# Patient Record
Sex: Male | Born: 1995 | Race: White | Hispanic: No | Marital: Single | Smoking: Never smoker
Health system: Southern US, Community
[De-identification: ages and names within clinical notes are randomized; demographics above are authoritative.]

---

## 2020-11-25 ENCOUNTER — Encounter: Payer: Self-pay | Admitting: Adult Health

## 2020-11-25 ENCOUNTER — Ambulatory Visit (INDEPENDENT_AMBULATORY_CARE_PROVIDER_SITE_OTHER): Payer: 59 | Admitting: Adult Health

## 2020-11-25 ENCOUNTER — Other Ambulatory Visit: Payer: Self-pay

## 2020-11-25 VITALS — BP 120/76 | HR 97 | Temp 98.8°F | Resp 16 | Ht 71.0 in | Wt 173.0 lb

## 2020-11-25 DIAGNOSIS — J301 Allergic rhinitis due to pollen: Secondary | ICD-10-CM

## 2020-11-25 DIAGNOSIS — U071 COVID-19: Secondary | ICD-10-CM | POA: Diagnosis not present

## 2020-11-25 DIAGNOSIS — Z8709 Personal history of other diseases of the respiratory system: Secondary | ICD-10-CM

## 2020-11-25 DIAGNOSIS — M62838 Other muscle spasm: Secondary | ICD-10-CM

## 2020-11-25 DIAGNOSIS — E559 Vitamin D deficiency, unspecified: Secondary | ICD-10-CM

## 2020-11-25 DIAGNOSIS — R140 Abdominal distension (gaseous): Secondary | ICD-10-CM | POA: Insufficient documentation

## 2020-11-25 DIAGNOSIS — Z0000 Encounter for general adult medical examination without abnormal findings: Secondary | ICD-10-CM | POA: Diagnosis not present

## 2020-11-25 DIAGNOSIS — R202 Paresthesia of skin: Secondary | ICD-10-CM

## 2020-11-25 DIAGNOSIS — Z1389 Encounter for screening for other disorder: Secondary | ICD-10-CM

## 2020-11-25 DIAGNOSIS — H6123 Impacted cerumen, bilateral: Secondary | ICD-10-CM

## 2020-11-25 DIAGNOSIS — M542 Cervicalgia: Secondary | ICD-10-CM

## 2020-11-25 MED ORDER — CYCLOBENZAPRINE HCL 10 MG PO TABS: 10.0000 mg | ORAL_TABLET | Freq: Every evening | ORAL | 0 refills | Status: DC | PRN

## 2020-11-25 NOTE — Patient Instructions (Addendum)
Muscle Cramps and Spasms Muscle cramps and spasms are when muscles tighten by themselves. They usually get better within minutes. Muscle cramps are painful. They are usually stronger and last longer than muscle spasms. Muscle spasms may or may not be painful. They can last a few seconds or much longer. Cramps and spasms can affect any muscle, but they occur most often in the calf muscles of the leg. They are usually not caused by a serious problem. In many cases, the cause is not known. Some common causes include:  Doing more physical work or exercise than your body is ready for.  Using the muscles too much (overuse) by repeating certain movements too many times.  Staying in a certain position for a long time.  Playing a sport or doing an activity without preparing properly.  Using bad form or technique while playing a sport or doing an activity.  Not having enough water in your body (dehydration).  Injury.  Side effects of some medicines.  Low levels of the salts and minerals in your blood (electrolytes), such as low potassium or calcium. Follow these instructions at home: Managing pain and stiffness      Massage, stretch, and relax the muscle. Do this for many minutes at a time.  If told, put heat on tight or tense muscles as often as told by your doctor. Use the heat source that your doctor recommends, such as a moist heat pack or a heating pad. ? Place a towel between your skin and the heat source. ? Leave the heat on for 20-30 minutes. ? Remove the heat if your skin turns bright red. This is very important if you are not able to feel pain, heat, or cold. You may have a greater risk of getting burned.  If told, put ice on the affected area. This may help if you are sore or have pain after a cramp or spasm. ? Put ice in a plastic bag. ? Place a towel between your skin and the bag. ? Leave the ice on for 20 minutes, 2-3 times a day.  Try taking hot showers or baths to help  relax tight muscles. Eating and drinking  Drink enough fluid to keep your pee (urine) pale yellow.  Eat a healthy diet to help ensure that your muscles work well. This should include: ? Fruits and vegetables. ? Lean protein. ? Whole grains. ? Low-fat or nonfat dairy products. General instructions  If you are having cramps often, avoid intense exercise for several days.  Take over-the-counter and prescription medicines only as told by your doctor.  Watch for any changes in your symptoms.  Keep all follow-up visits as told by your doctor. This is important. Contact a doctor if:  Your cramps or spasms get worse or happen more often.  Your cramps or spasms do not get better with time. Summary  Muscle cramps and spasms are when muscles tighten by themselves. They usually get better within minutes.  Cramps and spasms occur most often in the calf muscles of the leg.  Massage, stretch, and relax the muscle. This may help the cramp or spasm go away.  Drink enough fluid to keep your pee (urine) pale yellow. This information is not intended to replace advice given to you by your health care provider. Make sure you discuss any questions you have with your health care provider. Document Revised: 04/01/2018 Document Reviewed: 04/01/2018 Elsevier Patient Education  2020 [employer] Inc. Debrox/ Carbamide Peroxide ear solution What is this medicine?  CARBAMIDE PEROXIDE (CAR bah mide per OX ide) is used to soften and help remove ear wax. This medicine may be used for other purposes; ask your health care provider or pharmacist if you have questions. COMMON BRAND NAME(S): Auro Ear, Auro Earache Relief, Debrox, Ear Drops, Ear Wax Removal, Ear Wax Remover, Earwax Treatment, Murine, Thera-Ear What should I tell my health care provider before I take this medicine? They need to know if you have any of these conditions:  dizziness  ear discharge  ear pain, irritation or  rash  infection  perforated eardrum (hole in eardrum)  an unusual or allergic reaction to carbamide peroxide, glycerin, hydrogen peroxide, other medicines, foods, dyes, or preservatives  pregnant or trying to get pregnant  breast-feeding How should I use this medicine? This medicine is only for use in the outer ear canal. Follow the directions carefully. Wash hands before and after use. The solution may be warmed by holding the bottle in the hand for 1 to 2 minutes. Lie with the affected ear facing upward. Place the proper number of drops into the ear canal. After the drops are instilled, remain lying with the affected ear upward for 5 minutes to help the drops stay in the ear canal. A cotton ball may be gently inserted at the ear opening for no longer than 5 to 10 minutes to ensure retention. Repeat, if necessary, for the opposite ear. Do not touch the tip of the dropper to the ear, fingertips, or other surface. Do not rinse the dropper after use. Keep container tightly closed. Talk to your pediatrician regarding the use of this medicine in children. While this drug may be used in children as young as 12 years for selected conditions, precautions do apply. Overdosage: If you think you have taken too much of this medicine contact a poison control center or emergency room at once. NOTE: This medicine is only for you. Do not share this medicine with others. What if I miss a dose? If you miss a dose, use it as soon as you can. If it is almost time for your next dose, use only that dose. Do not use double or extra doses. What may interact with this medicine? Interactions are not expected. Do not use any other ear products without asking your doctor or health care professional. This list may not describe all possible interactions. Give your health care provider a list of all the medicines, herbs, non-prescription drugs, or dietary supplements you use. Also tell them if you smoke, drink alcohol, or use  illegal drugs. Some items may interact with your medicine. What should I watch for while using this medicine? This medicine is not for long-term use. Do not use for more than 4 days without checking with your health care professional. Contact your doctor or health care professional if your condition does not start to get better within a few days or if you notice burning, redness, itching or swelling. What side effects may I notice from receiving this medicine? Side effects that you should report to your doctor or health care professional as soon as possible:  allergic reactions like skin rash, itching or hives, swelling of the face, lips, or tongue  burning, itching, and redness  worsening ear pain  rash Side effects that usually do not require medical attention (report to your doctor or health care professional if they continue or are bothersome):  abnormal sensation while putting the drops in the ear  temporary reduction in hearing (but not complete  loss of hearing) This list may not describe all possible side effects. Call your doctor for medical advice about side effects. You may report side effects to FDA at 1-800-FDA-1088. Where should I keep my medicine? Keep out of the reach of children. Store at room temperature between 15 and 30 degrees C (59 and 86 degrees F) in a tight, light-resistant container. Keep bottle away from excessive heat and direct sunlight. Throw away any unused medicine after the expiration date. NOTE: This sheet is a summary. It may not cover all possible information. If you have questions about this medicine, talk to your doctor, pharmacist, or health care provider.  2020 Elsevier/Gold Standard (2008-02-18 14:00:02) Health Maintenance, Male Adopting a healthy lifestyle and getting preventive care are important in promoting health and wellness. Ask your health care provider about:  The right schedule for you to have regular tests and exams.  Things you can do  on your own to prevent diseases and keep yourself healthy. What should I know about diet, weight, and exercise? Eat a healthy diet   Eat a diet that includes plenty of vegetables, fruits, low-fat dairy products, and lean protein.  Do not eat a lot of foods that are high in solid fats, added sugars, or sodium. Maintain a healthy weight Body mass index (BMI) is a measurement that can be used to identify possible weight problems. It estimates body fat based on height and weight. Your health care provider can help determine your BMI and help you achieve or maintain a healthy weight. Get regular exercise Get regular exercise. This is one of the most important things you can do for your health. Most adults should:  Exercise for at least 150 minutes each week. The exercise should increase your heart rate and make you sweat (moderate-intensity exercise).  Do strengthening exercises at least twice a week. This is in addition to the moderate-intensity exercise.  Spend less time sitting. Even light physical activity can be beneficial. Watch cholesterol and blood lipids Have your blood tested for lipids and cholesterol at 27 years of age, then have this test every 5 years. You may need to have your cholesterol levels checked more often if:  Your lipid or cholesterol levels are high.  You are older than 27 years of age.  You are at high risk for heart disease. What should I know about cancer screening? Many types of cancers can be detected early and may often be prevented. Depending on your health history and family history, you may need to have cancer screening at various ages. This may include screening for:  Colorectal cancer.  Prostate cancer.  Skin cancer.  Lung cancer. What should I know about heart disease, diabetes, and high blood pressure? Blood pressure and heart disease  High blood pressure causes heart disease and increases the risk of stroke. This is more likely to develop in  people who have high blood pressure readings, are of African descent, or are overweight.  Talk with your health care provider about your target blood pressure readings.  Have your blood pressure checked: ? Every 3-5 years if you are 7718-27 years of age. ? Every year if you are 27 years old or older.  If you are between the ages of 365 and 7475 and are a current or former smoker, ask your health care provider if you should have a one-time screening for abdominal aortic aneurysm (AAA). Diabetes Have regular diabetes screenings. This checks your fasting blood sugar level. Have the screening done:  Once every three years after age 84 if you are at a normal weight and have a low risk for diabetes.  More often and at a younger age if you are overweight or have a high risk for diabetes. What should I know about preventing infection? Hepatitis B If you have a higher risk for hepatitis B, you should be screened for this virus. Talk with your health care provider to find out if you are at risk for hepatitis B infection. Hepatitis C Blood testing is recommended for:  Everyone born from [birth year] through 1967.  Anyone with known risk factors for hepatitis C. Sexually transmitted infections (STIs)  You should be screened each year for STIs, including gonorrhea and chlamydia, if: ? You are sexually active and are younger than 27 years of age. ? You are older than 27 years of age and your health care provider tells you that you are at risk for this type of infection. ? Your sexual activity has changed since you were last screened, and you are at increased risk for chlamydia or gonorrhea. Ask your health care provider if you are at risk.  Ask your health care provider about whether you are at high risk for HIV. Your health care provider may recommend a prescription medicine to help prevent HIV infection. If you choose to take medicine to prevent HIV, you should first get tested for HIV. You should then be  tested every 3 months for as long as you are taking the medicine. Follow these instructions at home: Lifestyle  Do not use any products that contain nicotine or tobacco, such as cigarettes, e-cigarettes, and chewing tobacco. If you need help quitting, ask your health care provider.  Do not use street drugs.  Do not share needles.  Ask your health care provider for help if you need support or information about quitting drugs. Alcohol use  Do not drink alcohol if your health care provider tells you not to drink.  If you drink alcohol: ? Limit how much you have to 0-2 drinks a day. ? Be aware of how much alcohol is in your drink. In the [place] one drink equals one 12 oz bottle of beer (355 mL), one 5 oz glass of wine (148 mL), or one 1 oz glass of hard liquor (44 mL). General instructions  Schedule regular health, dental, and eye exams.  Stay current with your vaccines.  Tell your health care provider if: ? You often feel depressed. ? You have ever been abused or do not feel safe at home. Summary  Adopting a healthy lifestyle and getting preventive care are important in promoting health and wellness.  Follow your health care provider's instructions about healthy diet, exercising, and getting tested or screened for diseases.  Follow your health care provider's instructions on monitoring your cholesterol and blood pressure. This information is not intended to replace advice given to you by your health care provider. Make sure you discuss any questions you have with your health care provider. Document Revised: 10/30/2018 Document Reviewed: 10/30/2018 Elsevier Patient Education  2020 Elsevier Inc. Abdominal Bloating When you have abdominal bloating, your abdomen may feel full, tight, or painful. It may also look bigger than normal or swollen (distended). Common causes of abdominal bloating include:  Swallowing air.  Constipation.  Problems digesting food.  Eating too  much.  Irritable bowel syndrome. This is a condition that affects the large intestine.  Lactose intolerance. This is an inability to digest lactose, a natural sugar in dairy  products.  Celiac disease. This is a condition that affects the ability to digest gluten, a protein found in some grains.  Gastroparesis. This is a condition that slows down the movement of food in the stomach and small intestine. It is more common in people with diabetes mellitus.  Gastroesophageal reflux disease (GERD). This is a digestive condition that makes stomach acid flow back into the esophagus.  Urinary retention. This means that the body is holding onto urine, and the bladder cannot be emptied all the way. Follow these instructions at home: Eating and drinking  Avoid eating too much.  Try not to swallow air while talking or eating.  Avoid eating while lying down.  Avoid these foods and drinks: ? Foods that cause gas, such as broccoli, cabbage, cauliflower, and baked beans. ? Carbonated drinks. ? Hard candy. ? Chewing gum. Medicines  Take over-the-counter and prescription medicines only as told by your health care provider.  Take probiotic medicines. These medicines contain live bacteria or yeasts that can help digestion.  Take coated peppermint oil capsules. Activity  Try to exercise regularly. Exercise may help to relieve bloating that is caused by gas and relieve constipation. General instructions  Keep all follow-up visits as told by your health care provider. This is important. Contact a health care provider if:  You have nausea and vomiting.  You have diarrhea.  You have abdominal pain.  You have unusual weight loss or weight gain.  You have severe pain, and medicines do not help. Get help right away if:  You have severe chest pain.  You have trouble breathing.  You have shortness of breath.  You have trouble urinating.  You have darker urine than normal.  You have  blood in your stools or have dark, tarry stools. Summary  Abdominal bloating means that the abdomen is swollen.  Common causes of abdominal bloating are swallowing air, constipation, and problems digesting food.  Avoid eating too much and avoid swallowing air.  Avoid foods that cause gas, carbonated drinks, hard candy, and chewing gum. This information is not intended to replace advice given to you by your health care provider. Make sure you discuss any questions you have with your health care provider. Document Revised: 02/24/2019 Document Reviewed: 12/08/2016 Elsevier Patient Education  [place]. Psyllium granules or powder for solution What is this medicine? PSYLLIUM (SIL i yum) is a bulk-forming fiber laxative. This medicine is used to treat constipation. Increasing fiber in the diet may also help lower cholesterol and promote heart health for some people. This medicine may be used for other purposes; ask your health care provider or pharmacist if you have questions. COMMON BRAND NAME(S): Fiber Therapy, GenFiber, Geri-Mucil, Hydrocil, Konsyl, Metamucil, Metamucil MultiHealth, Mucilin, Natural Fiber Therapy, Reguloid What should I tell my health care provider before I take this medicine? They need to know if you have any of these conditions:  blockage in your bowel  difficulty swallowing  inflammatory bowel disease  phenylketonuria  stomach or intestine problems  sudden change in bowel habits lasting more than 2 weeks  an unusual or allergic reaction to psyllium, other medicines, dyes, or preservatives  pregnant or trying or get pregnant  breast-feeding How should I use this medicine? Mix this medicine into a full glass (240 mL) of water or other cool drink. Take this medicine by mouth. Follow the directions on the package labeling, or take as directed by your health care professional. Take your medicine at regular intervals. Do not take  your medicine more often  than directed. Talk to your pediatrician regarding the use of this medicine in children. While this drug may be prescribed for children as young as 27 years old for selected conditions, precautions do apply. Overdosage: If you think you have taken too much of this medicine contact a poison control center or emergency room at once. NOTE: This medicine is only for you. Do not share this medicine with others. What if I miss a dose? If you miss a dose, take it as soon as you can. If it is almost time for your next dose, take only that dose. Do not take double or extra doses. What may interact with this medicine? Interactions are not expected. Take this product at least 2 hours before or after other medicines. This list may not describe all possible interactions. Give your health care provider a list of all the medicines, herbs, non-prescription drugs, or dietary supplements you use. Also tell them if you smoke, drink alcohol, or use illegal drugs. Some items may interact with your medicine. What should I watch for while using this medicine? Check with your doctor or health care professional if your symptoms do not start to get better or if they get worse. Stop using this medicine and contact your doctor or health care professional if you have rectal bleeding or if you have to treat your constipation for more than 1 week. These could be signs of a more serious condition. Drink several glasses of water a day while you are taking this medicine. This will help to relieve constipation and prevent dehydration. What side effects may I notice from receiving this medicine? Side effects that you should report to your doctor or health care professional as soon as possible:  allergic reactions like skin rash, itching or hives, swelling of the face, lips, or tongue  breathing problems  chest pain  nausea, vomiting  rectal bleeding  trouble swallowing Side effects that usually do not require medical  attention (report to your doctor or health care professional if they continue or are bothersome):  bloating  gas  stomach cramps This list may not describe all possible side effects. Call your doctor for medical advice about side effects. You may report side effects to FDA at 1-800-FDA-1088. Where should I keep my medicine? Keep out of the reach of children. Store at room temperature between 15 and 30 degrees C (59 and 86 degrees F). Protect from moisture. Throw away any unused medicine after the expiration date. NOTE: This sheet is a summary. It may not cover all possible information. If you have questions about this medicine, talk to your doctor, pharmacist, or health care provider.  2020 Elsevier/Gold Standard (2018-04-02 15:41:08) Cyclobenzaprine tablets What is this medicine? CYCLOBENZAPRINE (sye kloe BEN za preen) is a muscle relaxer. It is used to treat muscle pain, spasms, and stiffness. This medicine may be used for other purposes; ask your health care provider or pharmacist if you have questions. COMMON BRAND NAME(S): Fexmid, Flexeril What should I tell my health care provider before I take this medicine? They need to know if you have any of these conditions:  heart disease, irregular heartbeat, or previous heart attack  liver disease  thyroid problem  an unusual or allergic reaction to cyclobenzaprine, tricyclic antidepressants, lactose, other medicines, foods, dyes, or preservatives  pregnant or trying to get pregnant  breast-feeding How should I use this medicine? Take this medicine by mouth with a glass of water. Follow the directions on the  prescription label. If this medicine upsets your stomach, take it with food or milk. Take your medicine at regular intervals. Do not take it more often than directed. Talk to your pediatrician regarding the use of this medicine in children. Special care may be needed. Overdosage: If you think you have taken too much of this  medicine contact a poison control center or emergency room at once. NOTE: This medicine is only for you. Do not share this medicine with others. What if I miss a dose? If you miss a dose, take it as soon as you can. If it is almost time for your next dose, take only that dose. Do not take double or extra doses. What may interact with this medicine? Do not take this medicine with any of the following medications:  MAOIs like Carbex, Eldepryl, Marplan, Nardil, and Parnate  narcotic medicines for cough  safinamide This medicine may also interact with the following medications:  alcohol  bupropion  antihistamines for allergy, cough and cold  certain medicines for anxiety or sleep  certain medicines for bladder problems like oxybutynin, tolterodine  certain medicines for depression like amitriptyline, fluoxetine, sertraline  certain medicines for Parkinson's disease like benztropine, trihexyphenidyl  certain medicines for seizures like phenobarbital, primidone  certain medicines for stomach problems like dicyclomine, hyoscyamine  certain medicines for travel sickness like scopolamine  general anesthetics like halothane, isoflurane, methoxyflurane, propofol  ipratropium  local anesthetics like lidocaine, pramoxine, tetracaine  medicines that relax muscles for surgery  narcotic medicines for pain  phenothiazines like chlorpromazine, mesoridazine, prochlorperazine, thioridazine  verapamil This list may not describe all possible interactions. Give your health care provider a list of all the medicines, herbs, non-prescription drugs, or dietary supplements you use. Also tell them if you smoke, drink alcohol, or use illegal drugs. Some items may interact with your medicine. What should I watch for while using this medicine? Tell your doctor or health care professional if your symptoms do not start to get better or if they get worse. You may get drowsy or dizzy. Do not drive, use  machinery, or do anything that needs mental alertness until you know how this medicine affects you. Do not stand or sit up quickly, especially if you are an older patient. This reduces the risk of dizzy or fainting spells. Alcohol may interfere with the effect of this medicine. Avoid alcoholic drinks. If you are taking another medicine that also causes drowsiness, you may have more side effects. Give your health care provider a list of all medicines you use. Your doctor will tell you how much medicine to take. Do not take more medicine than directed. Call emergency for help if you have problems breathing or unusual sleepiness. Your mouth may get dry. Chewing sugarless gum or sucking hard candy, and drinking plenty of water may help. Contact your doctor if the problem does not go away or is severe. What side effects may I notice from receiving this medicine? Side effects that you should report to your doctor or health care professional as soon as possible:  allergic reactions like skin rash, itching or hives, swelling of the face, lips, or tongue  breathing problems  chest pain  fast, irregular heartbeat  hallucinations  seizures  unusually weak or tired Side effects that usually do not require medical attention (report to your doctor or health care professional if they continue or are bothersome):  headache  nausea, vomiting This list may not describe all possible side effects. Call your doctor for  medical advice about side effects. You may report side effects to FDA at 1-800-FDA-1088. Where should I keep my medicine? Keep out of the reach of children. Store at room temperature between 15 and 30 degrees C (59 and 86 degrees F). Keep container tightly closed. Throw away any unused medicine after the expiration date. NOTE: This sheet is a summary. It may not cover all possible information. If you have questions about this medicine, talk to your doctor, pharmacist, or health care  provider.  2020 Elsevier/Gold Standard (2018-10-09 12:49:26)

## 2020-11-25 NOTE — Progress Notes (Signed)
New patient visit   Patient: Stephen Hernandez   DOB: [birth year]   24 y.o. Male  MRN: [id number] Visit Date: 11/25/2020  Today's healthcare provider: Jairo Ben, FNP   Chief Complaint  Patient presents with  . New Patient (Initial Visit)   Subjective    Stephen Hernandez is a 25 y.o. male who presents today as a new patient to establish care.  HPI  Patient reports that he feels fairly well today but has concerns to address. Patient reports that he had covid in October 2021 and states that when he had covid he experienced increased gas and states that he had episodes of neck pain and facial numbness of left side. Patient reports that he received covid vaccine yesteday and states that he began having neck pain and facial numbness again   Patient  denies any fever, body aches,chills, rash, chest pain, shortness of breath, nausea, vomiting, or diarrhea.  Denies dizziness, lightheadedness, pre syncopal or syncopal episodes.    History reviewed. No pertinent past medical history. History reviewed. No pertinent surgical history. Family Status  Relation Name Status  . Mother  Alive  . Father  Alive   Family History  Problem Relation Age of Onset  . Fibromyalgia Mother   . Seizures Father    Social History   Socioeconomic History  . Marital status: Single    Spouse name: Not on file  . Number of children: Not on file  . Years of education: Not on file  . Highest education level: Not on file  Occupational History  . Not on file  Tobacco Use  . Smoking status: Never Smoker  . Smokeless tobacco: Never Used  Substance and Sexual Activity  . Alcohol use: Never  . Drug use: Never  . Sexual activity: Yes  Other Topics Concern  . Not on file  Social History Narrative  . Not on file   Social Determinants of Health   Financial Resource Strain: Not on file  Food Insecurity: Not on file  Transportation Needs: Not on file  Physical Activity: Not on file  Stress: Not  on file  Social Connections: Not on file   No outpatient medications prior to visit.   No facility-administered medications prior to visit.   Allergies  Allergen Reactions  . Penicillins Hives     There is no immunization history on file for this patient.  Health Maintenance  Topic Date Due  . COVID-19 Vaccine (1) Never done  . INFLUENZA VACCINE  02/17/2021 (Originally 06/20/2020)  . TETANUS/TDAP  11/25/2021 (Originally 02/20/2013)  . Hepatitis C Screening  11/25/2021 (Originally Jun 13, 1996)  . HIV Screening  11/25/2021 (Originally 02/20/2009)    Patient Care Team: Flinchum, Eula Fried, FNP as PCP - General (Family Medicine)  Review of Systems  Constitutional: Negative.   HENT: Negative.   Eyes: Negative.   Respiratory: Negative.   Cardiovascular: Negative.   Gastrointestinal: Negative.   Endocrine: Negative.   Genitourinary: Negative.   Musculoskeletal: Negative.   Skin: Negative.   Allergic/Immunologic: Negative.   Neurological: Positive for numbness (x 2 see note ). Negative for dizziness, tremors, seizures, syncope, facial asymmetry, speech difficulty, weakness, light-headedness and headaches.  Hematological: Negative.   Psychiatric/Behavioral: Negative.       Objective    BP 120/76   Pulse 97   Temp 98.8 F (37.1 C) (Oral)   Resp 16   Ht 5\' 11"  (1.803 m)   Wt 173 lb (78.5 kg)   SpO2 100%  BMI 24.13 kg/m  Physical Exam Vitals and nursing note reviewed.  Constitutional:      General: He is awake. He is not in acute distress.    Appearance: Normal appearance. He is well-groomed and normal weight. He is not ill-appearing, toxic-appearing or diaphoretic.     Comments: Patient appers well, not sickly. Speaking in complete sentences. Patient moves on and off of exam table and in room without difficulty. Gait is normal in hall and in room. Patient is oriented to person place time and situation. Patient answers questions appropriately and engages eye contact and  verbal dialect with provider.   HENT:     Head: Normocephalic and atraumatic.     Right Ear: Tympanic membrane normal. There is impacted cerumen.     Left Ear: Tympanic membrane normal. There is impacted cerumen.     Nose: Nose normal.     Mouth/Throat:     Mouth: Mucous membranes are moist.     Pharynx: No oropharyngeal exudate or posterior oropharyngeal erythema.  Eyes:     General: No visual field deficit or scleral icterus.       Right eye: No discharge.        Left eye: No discharge.     Extraocular Movements: Extraocular movements intact.     Conjunctiva/sclera: Conjunctivae normal.     Pupils: Pupils are equal, round, and reactive to light.  Neck:     Vascular: No carotid bruit.  Cardiovascular:     Rate and Rhythm: Normal rate and regular rhythm.     Pulses: Normal pulses.     Heart sounds: Normal heart sounds. No murmur heard. No friction rub. No gallop.   Pulmonary:     Effort: Pulmonary effort is normal. No respiratory distress.     Breath sounds: Normal breath sounds. No stridor. No wheezing, rhonchi or rales.  Chest:     Chest wall: No tenderness.  Abdominal:     General: There is no distension.     Palpations: Abdomen is soft.     Tenderness: There is no abdominal tenderness.  Genitourinary:    Comments: Deferred.  Musculoskeletal:        General: Tenderness present. No swelling, deformity or signs of injury. Normal range of motion.     Cervical back: Normal range of motion and neck supple. Spasms present. No swelling, edema, deformity, erythema, signs of trauma, lacerations, rigidity, torticollis, tenderness, bony tenderness or crepitus. No pain with movement. Normal range of motion.     Thoracic back: Normal.     Lumbar back: Normal.       Back:     Right lower leg: No deformity, lacerations, tenderness or bony tenderness. 2+ Edema present.     Left lower leg: No deformity, lacerations, tenderness or bony tenderness. 2+ Edema present.  Lymphadenopathy:      Head:     Right side of head: No submental, submandibular, tonsillar, preauricular, posterior auricular or occipital adenopathy.     Left side of head: No submental, submandibular, tonsillar, preauricular, posterior auricular or occipital adenopathy.     Cervical: No cervical adenopathy.     Right cervical: No superficial, deep or posterior cervical adenopathy.    Left cervical: No superficial, deep or posterior cervical adenopathy.  Skin:    General: Skin is warm.     Capillary Refill: Capillary refill takes less than 2 seconds.     Findings: No erythema or rash.  Neurological:     Mental Status: He is alert  and oriented to person, place, and time.     GCS: GCS eye subscore is 4. GCS verbal subscore is 5. GCS motor subscore is 6.     Cranial Nerves: Cranial nerves are intact. No cranial nerve deficit, dysarthria or facial asymmetry.     Sensory: Sensation is intact.     Motor: Motor function is intact. No weakness.     Coordination: Coordination is intact.     Gait: Gait normal.     Deep Tendon Reflexes: Reflexes normal.     Reflex Scores:      Tricep reflexes are 2+ on the right side and 2+ on the left side.      Patellar reflexes are 2+ on the right side and 2+ on the left side. Psychiatric:        Attention and Perception: Attention normal.        Mood and Affect: Mood normal.        Speech: Speech normal.        Behavior: Behavior normal. Behavior is cooperative.        Thought Content: Thought content normal.        Cognition and Memory: Cognition and memory normal.        Judgment: Judgment normal.     Depression Screen No flowsheet data found. No results found for any visits on 11/25/20.  Assessment & Plan      Routine health maintenance - Plan: Lipid Panel w/o Chol/HDL Ratio  Lab test positive for detection of COVID-19 virus-08/2020  History of strep sore throat  Non-seasonal allergic rhinitis due to pollen  Neck pain - Plan: CBC with Differential/Platelet,  Comprehensive Metabolic Panel (CMET), TSH, B12 and Folate Panel  Paresthesia - Plan: CBC with Differential/Platelet, Comprehensive Metabolic Panel (CMET), TSH, B12 and Folate Panel  Vitamin D deficiency - Plan: VITAMIN D 25 Hydroxy (Vit-D Deficiency, Fractures)  Screening for blood or protein in urine - Plan: Urinalysis, Routine w reflex microscopic  Gassiness - Plan: DG Abd 1 View, Celiac Disease Panel  Muscle spasm  Bilateral impacted cerumen   Orders Placed This Encounter  Procedures  . DG Abd 1 View  . CBC with Differential/Platelet  . Comprehensive Metabolic Panel (CMET)  . TSH  . B12 and Folate Panel  . Urinalysis, Routine w reflex microscopic  . Lipid Panel w/o Chol/HDL Ratio  . VITAMIN D 25 Hydroxy (Vit-D Deficiency, Fractures)  . Celiac Disease Panel    Meds ordered this encounter  Medications  . cyclobenzaprine (FLEXERIL) 10 MG tablet    Sig: Take 1 tablet (10 mg total) by mouth at bedtime as needed for muscle spasms.    Dispense:  30 tablet    Refill:  0  discussed side effects of medication as above.   aadvise Debrox per package and retuirn for cerumen recheck and possible irrigation unable to visualize tympanic membrane or canals of ears due to cerumen. He denies any pain.   Red Flags discussed. The patient was given clear instructions to go to ER or return to medical center if any red flags develop, symptoms do not improve, worsen or new problems develop. They verbalized understanding.   Return in about 1 month (around 12/26/2020), or if symptoms worsen or fail to improve, for at any time for any worsening symptoms, Go to Emergency room/ urgent care if worse.    The entirety of the information documented in the History of Present Illness, Review of Systems and Physical Exam were personally obtained by me. Portions  of this information were initially documented by the CMA and reviewed by me for thoroughness and accuracy.     Jairo Ben, FNP   [facility] [phone] (phone) [phone] (fax)  [facility]

## 2020-11-26 LAB — URINALYSIS, ROUTINE W REFLEX MICROSCOPIC
Bilirubin, UA: NEGATIVE
Glucose, UA: NEGATIVE
Leukocytes,UA: NEGATIVE
Nitrite, UA: NEGATIVE
Protein,UA: NEGATIVE
RBC, UA: NEGATIVE
Specific Gravity, UA: 1.025 (ref 1.005–1.030)
Urobilinogen, Ur: 1 mg/dL (ref 0.2–1.0)
pH, UA: 7 (ref 5.0–7.5)

## 2020-12-20 ENCOUNTER — Other Ambulatory Visit: Payer: Self-pay | Admitting: Adult Health

## 2020-12-20 DIAGNOSIS — E559 Vitamin D deficiency, unspecified: Secondary | ICD-10-CM

## 2020-12-20 MED ORDER — VITAMIN D (ERGOCALCIFEROL) 1.25 MG (50000 UNIT) PO CAPS: 50000.0000 [IU] | ORAL_CAPSULE | ORAL | 0 refills | Status: AC

## 2020-12-20 NOTE — Progress Notes (Signed)
CBC, CMP, B12 and folate within normal limits.  Triglycerides elevated.  Discuss lifestyle modification with patient e.g. increase exercise, fiber, fruits, vegetables, lean meat, and omega 3/fish intake and decrease saturated fat.  Increase exercise.   Vitamin  D is low, this can contribute to poor sleep and fatigue, will send in prescription for Vitamin D at 50,000 units by mouth once every 7 days/(once weekly) for 12 weeks. Advise recheck lab Vitamin D in 1-2 weeks after completing vitamin d prescription. Lab iis walk in and is closed during lunch during regular office hours. Sent Vitamin D and ordered follow up lab.   Urinalysis was negative.  Celiac panel pending

## 2020-12-20 NOTE — Progress Notes (Signed)
Meds ordered this encounter  Medications  . Vitamin D, Ergocalciferol, (DRISDOL) 1.25 MG (50000 UNIT) CAPS capsule    Sig: Take 1 capsule (50,000 Units total) by mouth every 7 (seven) days. (taking one tablet per week) walk in lab in office 1-2 weeks after completing prescription.    Dispense:  12 capsule    Refill:  0   Orders Placed This Encounter  Procedures  . VITAMIN D 25 Hydroxy (Vit-D Deficiency, Fractures)   

## 2020-12-21 LAB — URINALYSIS, ROUTINE W REFLEX MICROSCOPIC
Bilirubin, UA: NEGATIVE
Glucose, UA: NEGATIVE
Ketones, UA: NEGATIVE
Leukocytes,UA: NEGATIVE
Nitrite, UA: NEGATIVE
Protein,UA: NEGATIVE
RBC, UA: NEGATIVE
Specific Gravity, UA: 1.028 (ref 1.005–1.030)
Urobilinogen, Ur: 1 mg/dL (ref 0.2–1.0)
pH, UA: 7 (ref 5.0–7.5)

## 2020-12-21 LAB — COMPREHENSIVE METABOLIC PANEL
ALT: 22 IU/L (ref 0–44)
AST: 23 IU/L (ref 0–40)
Albumin/Globulin Ratio: 1.7 (ref 1.2–2.2)
Albumin: 4.5 g/dL (ref 4.1–5.2)
Alkaline Phosphatase: 59 IU/L (ref 44–121)
BUN/Creatinine Ratio: 18 (ref 9–20)
BUN: 19 mg/dL (ref 6–20)
Bilirubin Total: <0.2 mg/dL (ref 0.0–1.2)
CO2: 21 mmol/L (ref 20–29)
Calcium: 9.5 mg/dL (ref 8.7–10.2)
Chloride: 103 mmol/L (ref 96–106)
Creatinine, Ser: 1.03 mg/dL (ref 0.76–1.27)
GFR calc Af Amer: 115 mL/min/{1.73_m2} (ref >59)
GFR calc non Af Amer: 100 mL/min/{1.73_m2} (ref >59)
Globulin, Total: 2.6 g/dL (ref 1.5–4.5)
Glucose: 66 mg/dL (ref 65–99)
Potassium: 4.8 mmol/L (ref 3.5–5.2)
Sodium: 140 mmol/L (ref 134–144)
Total Protein: 7.1 g/dL (ref 6.0–8.5)

## 2020-12-21 LAB — CELIAC DISEASE PANEL
Endomysial IgA: NEGATIVE
IgA/Immunoglobulin A, Serum: 288 mg/dL (ref 90–386)
Transglutaminase IgA: <2 U/mL (ref 0–3)

## 2020-12-21 LAB — CBC WITH DIFFERENTIAL/PLATELET
Basophils Absolute: 0.1 10*3/uL (ref 0.0–0.2)
Basos: 1 %
EOS (ABSOLUTE): 0.1 10*3/uL (ref 0.0–0.4)
Eos: 1 %
Hematocrit: 49.4 % (ref 37.5–51.0)
Hemoglobin: 16.4 g/dL (ref 13.0–17.7)
Immature Grans (Abs): 0 10*3/uL (ref 0.0–0.1)
Immature Granulocytes: 1 %
Lymphocytes Absolute: 3.1 10*3/uL (ref 0.7–3.1)
Lymphs: 37 %
MCH: 27.9 pg (ref 26.6–33.0)
MCHC: 33.2 g/dL (ref 31.5–35.7)
MCV: 84 fL (ref 79–97)
Monocytes Absolute: 0.7 10*3/uL (ref 0.1–0.9)
Monocytes: 9 %
Neutrophils Absolute: 4.4 10*3/uL (ref 1.4–7.0)
Neutrophils: 51 %
Platelets: 340 10*3/uL (ref 150–450)
RBC: 5.87 x10E6/uL — ABNORMAL HIGH (ref 4.14–5.80)
RDW: 12 % (ref 11.6–15.4)
WBC: 8.3 10*3/uL (ref 3.4–10.8)

## 2020-12-21 LAB — TSH: TSH: 1.32 u[IU]/mL (ref 0.450–4.500)

## 2020-12-21 LAB — LIPID PANEL W/O CHOL/HDL RATIO
Cholesterol, Total: 163 mg/dL (ref 100–199)
HDL: 40 mg/dL (ref >39)
LDL Chol Calc (NIH): 91 mg/dL (ref 0–99)
Triglycerides: 187 mg/dL — ABNORMAL HIGH (ref 0–149)
VLDL Cholesterol Cal: 32 mg/dL (ref 5–40)

## 2020-12-21 LAB — B12 AND FOLATE PANEL
Folate: 11.3 ng/mL (ref >3.0)
Vitamin B-12: 471 pg/mL (ref 232–1245)

## 2020-12-21 LAB — VITAMIN D 25 HYDROXY (VIT D DEFICIENCY, FRACTURES): Vit D, 25-Hydroxy: 25.9 ng/mL — ABNORMAL LOW (ref 30.0–100.0)

## 2020-12-23 ENCOUNTER — Ambulatory Visit: Payer: Self-pay | Admitting: Adult Health

## 2020-12-23 NOTE — Telephone Encounter (Signed)
Addressed in result notes; verbalizes understanding.

## 2020-12-27 NOTE — Progress Notes (Deleted)
      Established patient visit   Patient: Stephen Hernandez   DOB: Sep 02, 1996   25 y.o. Male  MRN: [id number] Visit Date: 12/28/2020  Today's healthcare provider: Jairo Ben, FNP   No chief complaint on file.  Subjective    HPI  Follow up for neck pain /muscle spasms  The patient was last seen for this 1 months ago. Changes made at last visit include started patient on Flexeril 10mg .  He reports {excellent/good/fair/poor:19665} compliance with treatment. He feels that condition is {improved/worse/unchanged:3041574}. He {is/is not:21021397} having side effects. ***  -----------------------------------------------------------------------------------------    {Show patient history (optional):23778::" "}   Medications: Outpatient Medications Prior to Visit  Medication Sig  . Vitamin D, Ergocalciferol, (DRISDOL) 1.25 MG (50000 UNIT) CAPS capsule Take 1 capsule (50,000 Units total) by mouth every 7 (seven) days. (taking one tablet per week) walk in lab in office 1-2 weeks after completing prescription.  . cyclobenzaprine (FLEXERIL) 10 MG tablet Take 1 tablet (10 mg total) by mouth at bedtime as needed for muscle spasms.   No facility-administered medications prior to visit.    Review of Systems  {Labs  Heme  Chem  Endocrine  Serology  Results Review (optional):23779::" "}   Objective    There were no vitals taken for this visit. {Show previous vital signs (optional):23777::" "}   Physical Exam  ***  No results found for any visits on 12/28/20.  Assessment & Plan     ***  No follow-ups on file.      {provider attestation***:1}   02/25/21, FNP  [facility] [phone] (phone) [phone] (fax)  [facility]

## 2020-12-28 ENCOUNTER — Ambulatory Visit: Payer: Self-pay | Admitting: Adult Health

## 2020-12-30 ENCOUNTER — Ambulatory Visit (INDEPENDENT_AMBULATORY_CARE_PROVIDER_SITE_OTHER): Payer: 59 | Admitting: Adult Health

## 2020-12-30 ENCOUNTER — Other Ambulatory Visit: Payer: Self-pay

## 2020-12-30 ENCOUNTER — Encounter: Payer: Self-pay | Admitting: Adult Health

## 2020-12-30 VITALS — BP 119/73 | HR 86 | Resp 16 | Wt 169.2 lb

## 2020-12-30 DIAGNOSIS — M542 Cervicalgia: Secondary | ICD-10-CM | POA: Diagnosis not present

## 2020-12-30 NOTE — Patient Instructions (Addendum)
Triglycerides Test Why am I having this test? Triglycerides are a type of fat in the body. Having a high level of triglycerides can increase your risk for heart disease. You may have this test as part of a routine physical exam. Health care providers recommend that adults have this test at least once every 5 years. If you have risk factors for heart disease or are being treated for high triglycerides, you may need to have this test more often. What is being tested? This test measures the amount of triglycerides in your blood. Triglycerides are naturally present in the body, and you also take in triglycerides by eating certain foods. Triglycerides may be measured as part of a test called a lipid profile, which tests triglycerides and cholesterol levels. What kind of sample is taken? A blood sample is required for this test. It may be collected by inserting a needle into a blood vessel, or by pricking a fingertip with a small needle (finger stick).   How do I prepare for this test?  Follow instructions from your health care provider about changing or stopping your regular medicines.  Do not eat or drink anything except water starting 9-12 hours before your test, or as long as told by your health care provider.  Do not drink alcohol starting at least 24 hours before your test.  Follow any instructions from your health care provider about dietary restrictions before your test. Tell a health care provider about:  All medicines you are taking, including vitamins, herbs, eye drops, creams, and over-the-counter medicines.  Any blood disorders you have.  Any medical conditions you have. How are the results reported? Your test results will be reported as a value that indicates how many triglycerides are in your blood. This will be given as milligrams of triglycerides per deciliter of blood (mg/dL). Your health care provider will compare your results to normal values that were established after  testing a large group of people (reference ranges). Reference ranges may vary among labs and hospitals. For this test, common reference ranges are:  Adults: Male: 40-160 mg/dL or 7.79-3.90 mmol/L High Triglycerides Eating Plan Triglycerides are a type of fat in the blood. High levels of triglycerides can increase your risk of heart disease and stroke. If your triglyceride levels are high, choosing the right foods can help lower your triglycerides and keep your heart healthy. Work with your health care provider or a diet and nutrition specialist (dietitian) to develop an eating plan that is right for you. What are tips for following this plan? General guidelines  Lose weight, if you are overweight. For most people, losing 5-10 lbs (2-5 kg) helps lower triglyceride levels. A weight-loss plan may include. ? 30 minutes of exercise at least 5 days a week. ? Reducing the amount of calories, sugar, and fat you eat.  Eat a wide variety of fresh fruits, vegetables, and whole grains. These foods are high in fiber.  Eat foods that contain healthy fats, such as fatty fish, nuts, seeds, and olive oil.  Avoid foods that are high in added sugar, added salt (sodium), saturated fat, and trans fat.  Avoid low-fiber, refined carbohydrates such as white bread, crackers, noodles, and white rice.  Avoid foods with partially hydrogenated oils (trans fats), such as fried foods or stick margarine.  Limit alcohol intake to no more than 1 drink a day for nonpregnant women and 2 drinks a day for men. One drink equals 12 oz of beer, 5 oz of wine, or  1 oz of hard liquor. Your health care provider may recommend that you drink less depending on your overall health.   Reading food labels  Check food labels for the amount of saturated fat. Choose foods with no or very little saturated fat.  Check food labels for the amount of trans fat. Choose foods with no trans fat.  Check food labels for the amount of cholesterol.  Choose foods low in cholesterol. Ask your dietitian how much cholesterol you should have each day.  Check food labels for the amount of sodium. Choose foods with less than 140 milligrams (mg) per serving. Shopping  Buy dairy products labeled as nonfat (skim) or low-fat (1%).  Avoid buying processed or prepackaged foods. These are often high in added sugar, sodium, and fat. Cooking  Choose healthy fats when cooking, such as olive oil or canola oil.  Cook foods using lower fat methods, such as baking, broiling, boiling, or grilling.  Make your own sauces, dressings, and marinades when possible, instead of buying them. Store-bought sauces, dressings, and marinades are often high in sodium and sugar. Meal planning  Eat more home-cooked food and less restaurant, buffet, and fast food.  Eat fatty fish at least 2 times each week. Examples of fatty fish include salmon, trout, mackerel, tuna, and herring.  If you eat whole eggs, do not eat more than 3 egg yolks per week. What foods are recommended? The items listed may not be a complete list. Talk with your dietitian about what dietary choices are best for you. Grains Whole wheat or whole grain breads, crackers, cereals, and pasta. Unsweetened oatmeal. Bulgur. Barley. Quinoa. Brown rice. Whole wheat flour tortillas. Vegetables Fresh or frozen vegetables. Low-sodium canned vegetables. Fruits All fresh, canned (in natural juice), or frozen fruits. Meats and other protein foods Skinless chicken or [place]. Ground chicken or [place]. Lean cuts of pork, trimmed of fat. Fish and seafood, especially salmon, trout, and herring. Egg whites. Dried beans, peas, or lentils. Unsalted nuts or seeds. Unsalted canned beans. Natural peanut or almond butter. Dairy Low-fat dairy products. Skim or low-fat (1%) milk. Reduced fat (2%) and low-sodium cheese. Low-fat ricotta cheese. Low-fat cottage cheese. Plain, low-fat yogurt. Fats and oils Tub margarine without  trans fats. Light or reduced-fat mayonnaise. Light or reduced-fat salad dressings. Avocado. Safflower, olive, sunflower, soybean, and canola oils. What foods are not recommended? The items listed may not be a complete list. Talk with your dietitian about what dietary choices are best for you. Grains White bread. White (regular) pasta. White rice. Cornbread. Bagels. Pastries. Crackers that contain trans fat. Vegetables Creamed or fried vegetables. Vegetables in a cheese sauce. Fruits Sweetened dried fruit. Canned fruit in syrup. Fruit juice. Meats and other protein foods Fatty cuts of meat. Ribs. Chicken wings. Tomasa Blase. Sausage. Bologna. Salami. Chitterlings. Fatback. Hot dogs. Bratwurst. Packaged lunch meats. Dairy Whole or reduced-fat (2%) milk. Half-and-half. Cream cheese. Full-fat or sweetened yogurt. Full-fat cheese. Nondairy creamers. Whipped toppings. Processed cheese or cheese spreads. Cheese curds. Beverages Alcohol. Sweetened drinks, such as soda, lemonade, fruit drinks, or punches. Fats and oils Butter. Stick margarine. Lard. Shortening. Ghee. Bacon fat. Tropical oils, such as coconut, palm kernel, or palm oils. Sweets and desserts Corn syrup. Sugars. Honey. Molasses. Candy. Jam and jelly. Syrup. Sweetened cereals. Cookies. Pies. Cakes. Donuts. Muffins. Ice cream. Condiments Store-bought sauces, dressings, and marinades that are high in sugar, such as ketchup and barbecue sauce. Summary  High levels of triglycerides can increase the risk of heart disease and stroke.  Choosing the right foods can help lower your triglycerides.  Eat plenty of fresh fruits, vegetables, and whole grains. Choose low-fat dairy and lean meats. Eat fatty fish at least twice a week.  Avoid processed and prepackaged foods with added sugar, sodium, saturated fat, and trans fat.  If you need suggestions or have questions about what types of food are good for you, talk with your health care provider or a  dietitian. This information is not intended to replace advice given to you by your health care provider. Make sure you discuss any questions you have with your health care provider. Document Revised: 03/10/2020 Document Reviewed: 03/10/2020 Elsevier Patient Education  2021 Elsevier Inc. ?  (SI units). ? Male: 35-135 mg/dL or 5.95-6.38 mmol/L (SI units).  Teens 4-56 years old: ? Male: 40-163 mg/dL. ? Male: 40-128 mg/dL.  Children 6-64 years old: ? Male: 36-138 mg/dL. ? Male: 41-138 mg/dL.  Children 65-78 years old: ? Male: 31-108 mg/dL. ? Male: 35-114 mg/dL.  Children 5 years or younger: ? Male: 30-86 mg/dL. ? Male: 32-99 mg/dL. What do the results mean? Results that are within the reference range are considered normal. This means that you have a normal amount of triglycerides in your blood. Results that are higher than your reference range mean that there are too many triglycerides in your blood. This may mean that you:  Have a higher risk of heart disease.  Have certain diseases that cause high triglycerides, such as diabetes.  Are taking certain medicines such as estrogens and oral contraceptives. Results that are lower than your reference range mean that there are too few triglycerides in your blood. This may mean that you are not getting enough nutrients in your diet (malnutrition). Talk with your health care provider about what your results mean. Questions to ask your health care provider Ask your health care provider, or the department that is doing the test:  When will my results be ready?  How will I get my results?  What are my treatment options?  What other tests do I need?  What are my next steps? Summary  Triglycerides are a type of fat in the body. Having a high level of triglycerides can increase your risk for heart disease.  You may have this test as part of a routine physical exam. Triglycerides may be measured as part of a test called a  lipid profile, which tests triglycerides and cholesterol.  Talk with your health care provider about what your results mean. This information is not intended to replace advice given to you by your health care provider. Make sure you discuss any questions you have with your health care provider. Document Revised: 02/25/2020 Document Reviewed: 02/25/2020 Elsevier Patient Education  2021 [employer].

## 2020-12-30 NOTE — Progress Notes (Signed)
Established patient visit   Patient: Stephen Hernandez   DOB: 1996/08/29   24 y.o. Male  MRN: [id number] Visit Date: 12/30/2020  Today's healthcare provider: Jairo Ben, FNP   Chief Complaint  Patient presents with  . Follow-up   Subjective    HPI  Follow up for Neck Pain/Muscle Spasms The patient was last seen for this 1 months ago. Changes made at last visit include labs ordered and patient started on Flexeril .  He reports good compliance with treatment. He feels that condition is improved when he takes the muscle relaxer. . Patient reports he still has pain around neck especially on left side that he describes as a tightness feeling radiating into his shoulders.  He is not having side effects.    Patient  denies any fever, body aches,chills, rash, chest pain, shortness of breath, nausea, vomiting, or diarrhea.  Denies dizziness, lightheadedness, pre syncopal or syncopal episodes.   -----------------------------------------------------------------------------------------    Patient Active Problem List   Diagnosis Date Noted  . Neck pain 11/25/2020  . History of strep sore throat 11/25/2020  . Lab test positive for detection of COVID-19 virus-08/2020 11/25/2020  . Screening for blood or protein in urine 11/25/2020  . Non-seasonal allergic rhinitis due to pollen 11/25/2020  . Gassiness 11/25/2020  . Vitamin D deficiency 11/25/2020  . Paresthesia 11/25/2020  . Bilateral impacted cerumen 11/25/2020  . Muscle spasm 11/25/2020   No past medical history on file. No past surgical history on file. Social History   Tobacco Use  . Smoking status: Never Smoker  . Smokeless tobacco: Never Used  Substance Use Topics  . Alcohol use: Never  . Drug use: Never   Social History   Socioeconomic History  . Marital status: Single    Spouse name: Not on file  . Number of children: Not on file  . Years of education: Not on file  . Highest education level: Not  on file  Occupational History  . Not on file  Tobacco Use  . Smoking status: Never Smoker  . Smokeless tobacco: Never Used  Substance and Sexual Activity  . Alcohol use: Never  . Drug use: Never  . Sexual activity: Yes  Other Topics Concern  . Not on file  Social History Narrative  . Not on file   Social Determinants of Health   Financial Resource Strain: Not on file  Food Insecurity: Not on file  Transportation Needs: Not on file  Physical Activity: Not on file  Stress: Not on file  Social Connections: Not on file  Intimate Partner Violence: Not on file   Family Status  Relation Name Status  . Mother  Alive  . Father  Alive   Family History  Problem Relation Age of Onset  . Fibromyalgia Mother   . Seizures Father    Allergies  Allergen Reactions  . Penicillins Hives       Medications: Outpatient Medications Prior to Visit  Medication Sig  . Vitamin D, Ergocalciferol, (DRISDOL) 1.25 MG (50000 UNIT) CAPS capsule Take 1 capsule (50,000 Units total) by mouth every 7 (seven) days. (taking one tablet per week) walk in lab in office 1-2 weeks after completing prescription.  . cyclobenzaprine (FLEXERIL) 10 MG tablet Take 1 tablet (10 mg total) by mouth at bedtime as needed for muscle spasms.   No facility-administered medications prior to visit.    Review of Systems  Last CBC Lab Results  Component Value Date   WBC 8.3  12/17/2020   HGB 16.4 12/17/2020   HCT 49.4 12/17/2020   MCV 84 12/17/2020   MCH 27.9 12/17/2020   RDW 12.0 12/17/2020   PLT 340 12/17/2020   Last metabolic panel Lab Results  Component Value Date   GLUCOSE 66 12/17/2020   NA 140 12/17/2020   K 4.8 12/17/2020   CL 103 12/17/2020   CO2 21 12/17/2020   BUN 19 12/17/2020   CREATININE 1.03 12/17/2020   GFRNONAA 100 12/17/2020   GFRAA 115 12/17/2020   CALCIUM 9.5 12/17/2020   PROT 7.1 12/17/2020   ALBUMIN 4.5 12/17/2020   LABGLOB 2.6 12/17/2020   AGRATIO 1.7 12/17/2020   BILITOT <0.2  12/17/2020   ALKPHOS 59 12/17/2020   AST 23 12/17/2020   ALT 22 12/17/2020   Last lipids Lab Results  Component Value Date   CHOL 163 12/17/2020   HDL 40 12/17/2020   LDLCALC 91 12/17/2020   TRIG 187 (H) 12/17/2020   Last hemoglobin A1c No results found for: HGBA1C Last thyroid functions Lab Results  Component Value Date   TSH 1.320 12/17/2020   Last vitamin D Lab Results  Component Value Date   VD25OH 25.9 (L) 12/17/2020   Last vitamin B12 and Folate Lab Results  Component Value Date   VITAMINB12 471 12/17/2020   FOLATE 11.3 12/17/2020       Objective    BP 119/73   Pulse 86   Resp 16   Wt 169 lb 3.2 oz (76.7 kg)   BMI 23.60 kg/m  BP Readings from Last 3 Encounters:  12/30/20 119/73  11/25/20 120/76   Wt Readings from Last 3 Encounters:  12/30/20 169 lb 3.2 oz (76.7 kg)  11/25/20 173 lb (78.5 kg)       Physical Exam Vitals and nursing note reviewed.  Constitutional:      General: He is active. He is not in acute distress.    Appearance: Normal appearance. He is well-developed and well-nourished. He is not ill-appearing, toxic-appearing, sickly-appearing or diaphoretic.     Comments: Patient is alert and oriented and responsive to questions Engages in eye contact with provider. Speaks in full sentences without any pauses without any shortness of breath or distress.    HENT:     Head: Normocephalic and atraumatic.     Right Ear: Hearing, tympanic membrane, ear canal and external ear normal.     Left Ear: Hearing, tympanic membrane, ear canal and external ear normal.     Nose: Nose normal.     Mouth/Throat:     Mouth: Oropharynx is clear and moist and mucous membranes are normal. Mucous membranes are moist.     Pharynx: Uvula midline. No oropharyngeal exudate.  Eyes:     General: Lids are normal. No scleral icterus.       Right eye: No discharge.        Left eye: No discharge.     Extraocular Movements: EOM normal.     Conjunctiva/sclera:  Conjunctivae normal.     Pupils: Pupils are equal, round, and reactive to light.  Neck:     Thyroid: No thyromegaly.     Vascular: Normal carotid pulses. No carotid bruit, hepatojugular reflux or JVD.     Trachea: Trachea and phonation normal. No tracheal tenderness or tracheal deviation.     Meningeal: Brudzinski's sign absent.  Cardiovascular:     Rate and Rhythm: Normal rate and regular rhythm.     Pulses: Normal pulses and intact distal pulses.  Heart sounds: Normal heart sounds, S1 normal and S2 normal. Heart sounds not distant. No murmur heard. No friction rub. No gallop.   Pulmonary:     Effort: Pulmonary effort is normal. No accessory muscle usage or respiratory distress.     Breath sounds: Normal breath sounds. No stridor. No wheezing or rales.  Chest:     Chest wall: No tenderness.  Abdominal:     General: Bowel sounds are normal. There is no distension. Aorta is normal.     Palpations: Abdomen is soft. There is no mass.     Tenderness: There is no abdominal tenderness. There is no guarding or rebound.     Hernia: No hernia is present.  Musculoskeletal:        General: No tenderness, deformity or edema. Normal range of motion.     Cervical back: Full passive range of motion without pain, normal range of motion and neck supple.     Comments: Patient moves on and off of exam table and in room without difficulty. Gait is normal in hall and in room. Patient is oriented to person place time and situation. Patient answers questions appropriately and engages in conversation.   Lymphadenopathy:     Head:     Right side of head: No submental, submandibular, tonsillar, preauricular, posterior auricular or occipital adenopathy.     Left side of head: No submental, submandibular, tonsillar, preauricular, posterior auricular or occipital adenopathy.     Cervical: No cervical adenopathy.     Upper Body:  No axillary adenopathy present. Skin:    General: Skin is warm, dry and intact.      Capillary Refill: Capillary refill takes less than 2 seconds.     Coloration: Skin is not pale.     Findings: No erythema or rash.     Nails: There is no clubbing or cyanosis.  Neurological:     Mental Status: He is alert and oriented to person, place, and time.     GCS: GCS eye subscore is 4. GCS verbal subscore is 5. GCS motor subscore is 6.     Cranial Nerves: No cranial nerve deficit.     Sensory: No sensory deficit.     Motor: No abnormal muscle tone.     Coordination: He displays a negative Romberg sign. Coordination normal.     Gait: Gait normal.     Deep Tendon Reflexes: Strength normal and reflexes are normal and symmetric. Reflexes normal.  Psychiatric:        Mood and Affect: Mood and affect normal.        Speech: Speech normal.        Behavior: Behavior normal.        Thought Content: Thought content normal.        Cognition and Memory: Cognition and memory normal.        Judgment: Judgment normal.       No results found for any visits on 12/30/20.  Assessment & Plan     Neck pain - Plan: DG Cervical Spine Complete  Muscle relaxer as directed PRN since is helping. NSAID appropriate as well as heat/ ice as needed.    Return if symptoms worsen or fail to improve, for at any time for any worsening symptoms, Go to Emergency room/ urgent care if worse.      The entirety of the information documented in the History of Present Illness, Review of Systems and Physical Exam were personally obtained by me. Portions of this  information were initially documented by the CMA and reviewed by me for thoroughness and accuracy.    Jairo Ben, FNP  [facility] [phone] (phone) [phone] (fax)  [facility]

## 2021-01-03 ENCOUNTER — Encounter: Payer: Self-pay | Admitting: Adult Health

## 2021-02-09 ENCOUNTER — Ambulatory Visit
Admission: RE | Admit: 2021-02-09 | Discharge: 2021-02-09 | Disposition: A | Discharge status: Home / Self Care | Payer: 59 | Source: Ambulatory Visit | Attending: Adult Health | Admitting: Adult Health

## 2021-02-09 ENCOUNTER — Other Ambulatory Visit: Payer: Self-pay

## 2021-02-09 DIAGNOSIS — M542 Cervicalgia: Secondary | ICD-10-CM | POA: Diagnosis not present

## 2021-02-11 NOTE — Progress Notes (Signed)
Negative cervical x ray.

## 2022-01-06 IMAGING — CR DG CERVICAL SPINE COMPLETE 4+V
1 series · 6 of 6 positions shown · non-contrast
Comparison: None.

CLINICAL DATA: Pain

EXAM:
CERVICAL SPINE - COMPLETE 4+ VIEW

[Series 1: dg cervical spine complete · 0.14mm/px · 6 of 6 slices shown]
[im 1/6]
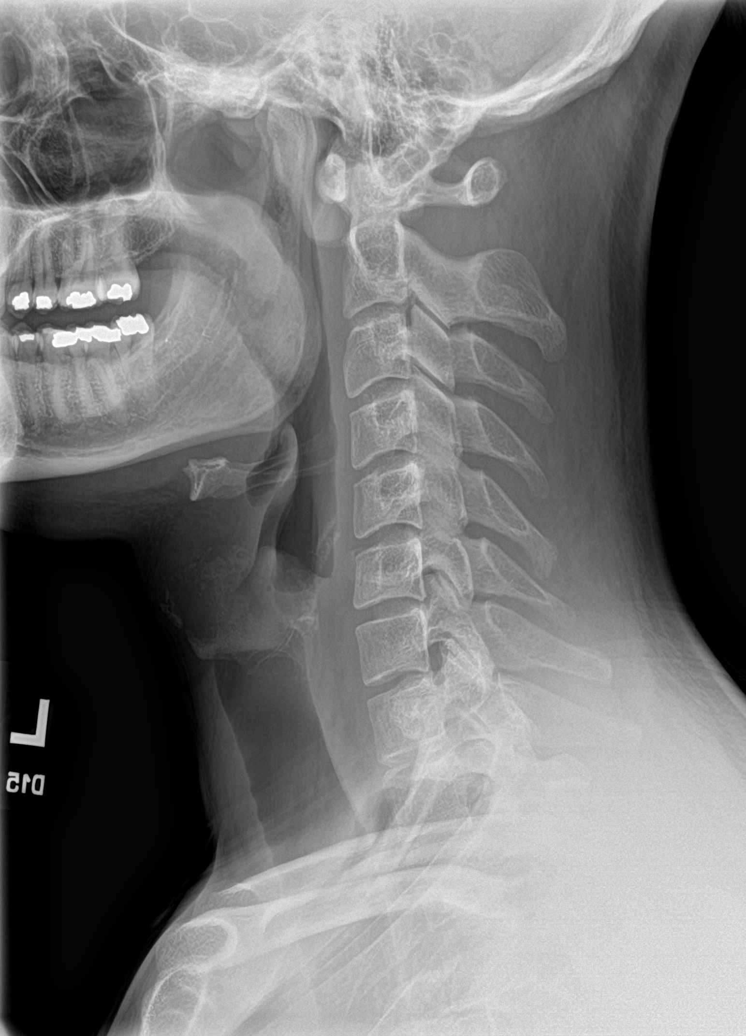
[im 2/6]
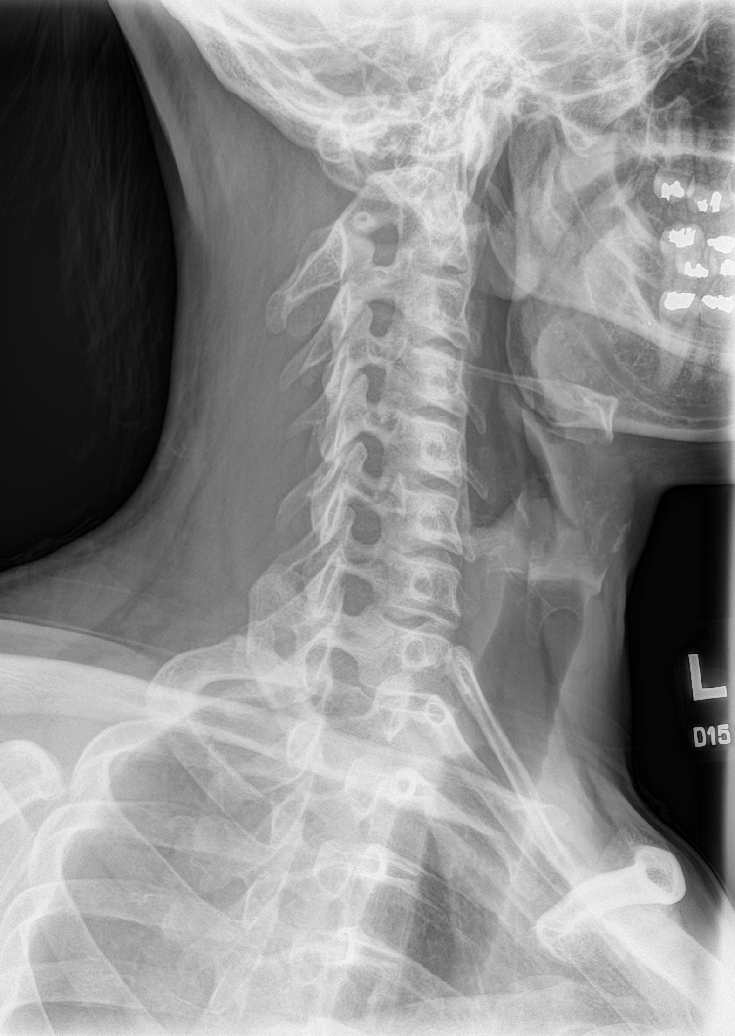
[im 3/6]
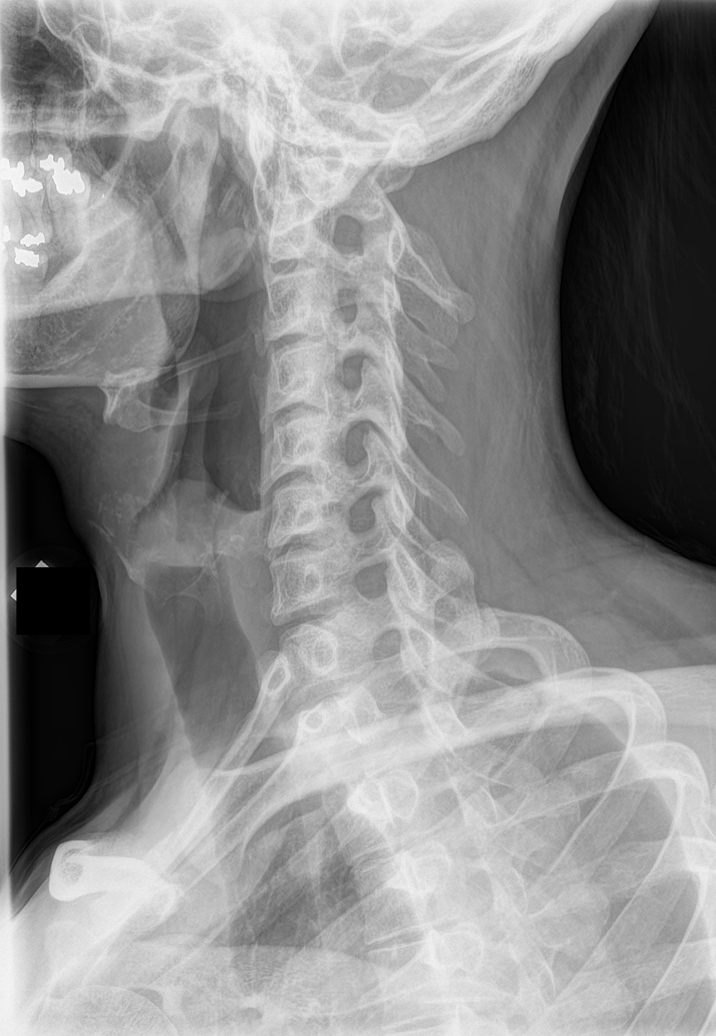
[im 4/6]
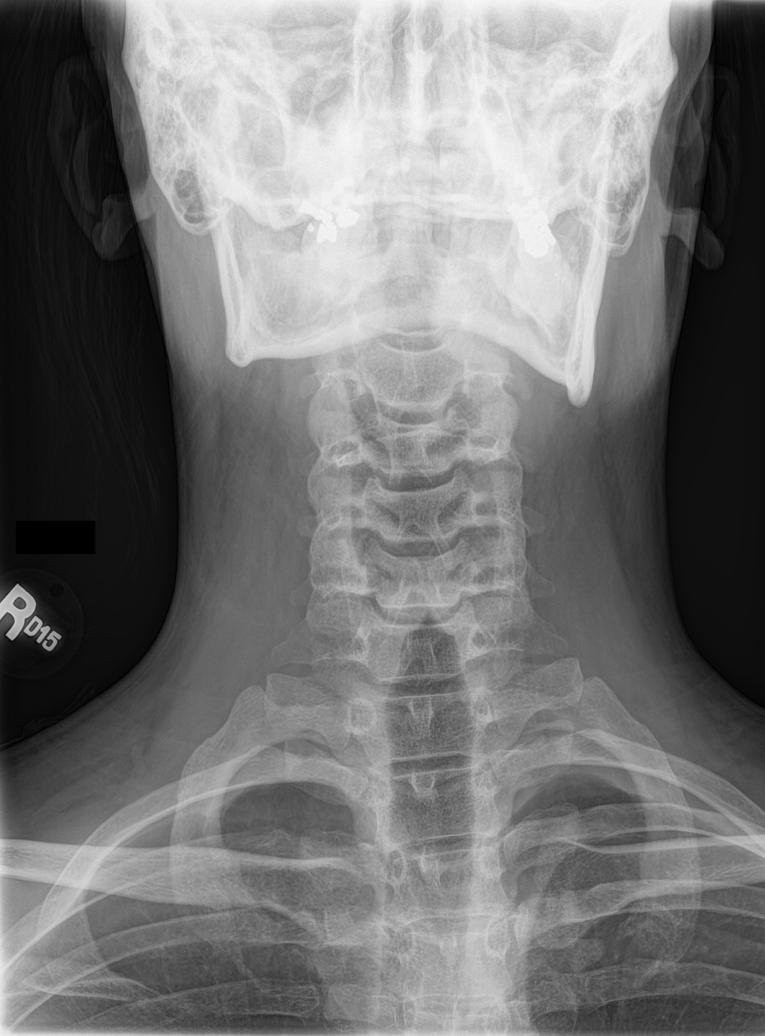
[im 5/6]
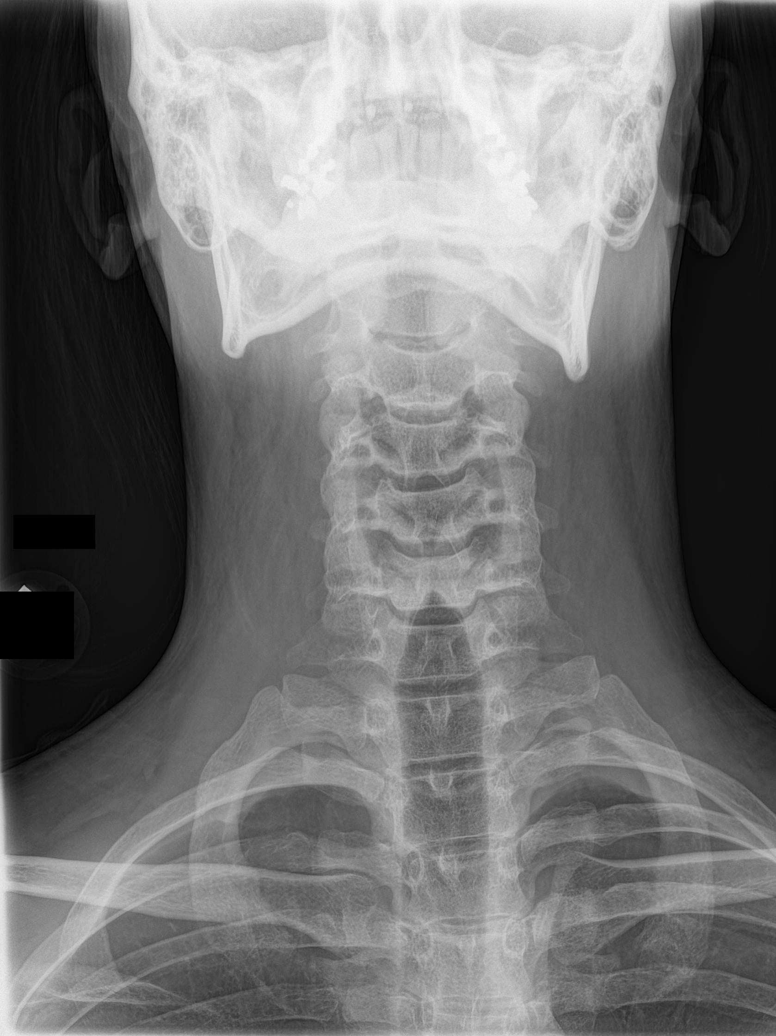
[im 6/6]
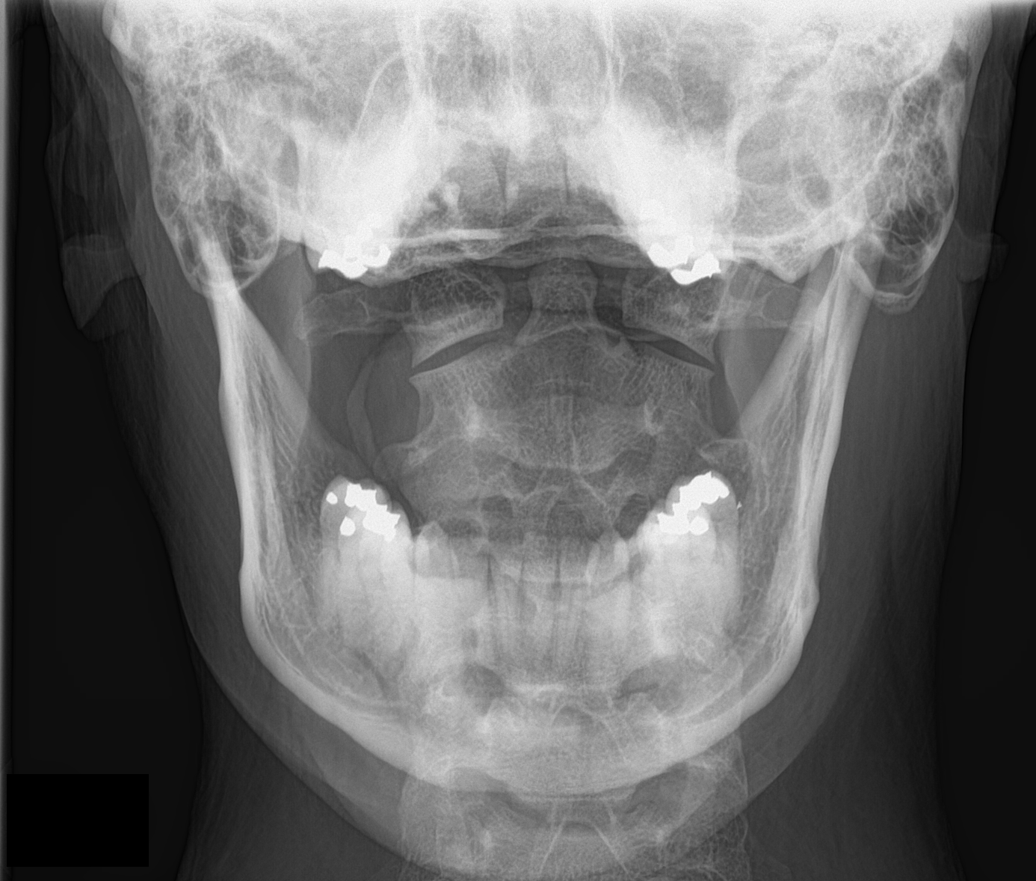

[6 of 6 positions shown; findings below may reference images not displayed]

FINDINGS: There is no evidence of cervical spine fracture or prevertebral soft
tissue swelling. Alignment is normal. No other significant bone
abnormalities are identified.
IMPRESSION: Negative cervical spine radiographs.

## 2022-07-27 ENCOUNTER — Ambulatory Visit (INDEPENDENT_AMBULATORY_CARE_PROVIDER_SITE_OTHER): Payer: 59 | Admitting: Family Medicine

## 2022-07-27 ENCOUNTER — Encounter: Payer: Self-pay | Admitting: Family Medicine

## 2022-07-27 VITALS — BP 134/87 | HR 90 | Temp 98.0°F | Resp 16 | Ht 72.0 in | Wt 171.7 lb

## 2022-07-27 DIAGNOSIS — J301 Allergic rhinitis due to pollen: Secondary | ICD-10-CM | POA: Diagnosis not present

## 2022-07-27 DIAGNOSIS — J40 Bronchitis, not specified as acute or chronic: Secondary | ICD-10-CM | POA: Diagnosis not present

## 2022-07-27 DIAGNOSIS — R059 Cough, unspecified: Secondary | ICD-10-CM | POA: Diagnosis not present

## 2022-07-27 MED ORDER — FAMOTIDINE 20 MG PO TABS: 20.0000 mg | ORAL_TABLET | Freq: Every day | ORAL | 0 refills | Status: AC

## 2022-07-27 MED ORDER — BENZONATATE 100 MG PO CAPS: 100.0000 mg | ORAL_CAPSULE | Freq: Two times a day (BID) | ORAL | 0 refills | Status: AC | PRN

## 2022-07-27 MED ORDER — FLUTICASONE PROPIONATE 50 MCG/ACT NA SUSP: 2.0000 | Freq: Every day | NASAL | 6 refills | Status: AC

## 2022-07-27 NOTE — Progress Notes (Signed)
Established patient visit  I,Stephen Hernandez,acting as a scribe for [employer], MD.,have documented all relevant documentation on the behalf of Stephen Ramp, MD,as directed by  Stephen Ramp, MD while in the presence of Stephen Ramp, MD.   Patient: Stephen Hernandez   DOB: 24-Jul-1996   26 y.o. Male  MRN: [id number] Visit Date: 07/27/2022  Today's healthcare provider: Ronnald Ramp, MD   Chief Complaint  Patient presents with   URI   Subjective    Cough is worst in AM and at night   Has been trying to keep up water intake  Spends a lot of time outside  At night has been drinking warm fluids including tea and honey  Reports having one episode when his throat had a full sensation that spontaneously resolved    URI  This is a new problem. The current episode started 1 to 4 weeks ago. The problem has been unchanged. There has been no fever. Associated symptoms include congestion and coughing. Pertinent negatives include no ear pain, headaches, plugged ear sensation, rhinorrhea, sinus pain, sneezing, sore throat or wheezing. He has tried increased fluids and antihistamine for the symptoms. The treatment provided no relief.    Medications: Outpatient Medications Prior to Visit  Medication Sig   Vitamin D, Ergocalciferol, (DRISDOL) 1.25 MG (50000 UNIT) CAPS capsule Take 1 capsule (50,000 Units total) by mouth every 7 (seven) days. (taking one tablet per week) walk in lab in office 1-2 weeks after completing prescription.   [DISCONTINUED] cyclobenzaprine (FLEXERIL) 10 MG tablet Take 1 tablet (10 mg total) by mouth at bedtime as needed for muscle spasms.   No facility-administered medications prior to visit.    Review of Systems  HENT:  Positive for congestion and postnasal drip. Negative for ear pain, rhinorrhea, sinus pain, sneezing and sore throat.   Respiratory:  Positive for cough. Negative for wheezing.         Productive cough  No hemoptysis   Musculoskeletal:  Negative for myalgias.  Neurological:  Negative for headaches.     Objective    BP 134/87 (BP Location: Left Arm, Patient Position: Sitting, Cuff Size: Normal)   Pulse 90   Temp 98 F (36.7 C) (Oral)   Resp 16   Ht 6' (1.829 m)   Wt 171 lb 11.2 oz (77.9 kg)   SpO2 98%   BMI 23.29 kg/m   Physical Exam Constitutional:      General: He is not in acute distress.    Appearance: Normal appearance. He is not ill-appearing or toxic-appearing.  HENT:     Nose: Rhinorrhea present.     Mouth/Throat:     Mouth: Mucous membranes are moist.     Pharynx: No oropharyngeal exudate or posterior oropharyngeal erythema.  Eyes:     General:        Right eye: No discharge.        Left eye: No discharge.     Conjunctiva/sclera: Conjunctivae normal.  Cardiovascular:     Rate and Rhythm: Normal rate and regular rhythm.     Pulses: Normal pulses.     Heart sounds: Normal heart sounds. No murmur heard. Pulmonary:     Effort: Pulmonary effort is normal. No tachypnea, accessory muscle usage, prolonged expiration, respiratory distress or retractions.     Breath sounds: Normal breath sounds. No stridor. No decreased breath sounds, wheezing, rhonchi or rales.  Musculoskeletal:     Cervical back: Normal range of motion and neck supple. No  rigidity.  Lymphadenopathy:     Cervical: No cervical adenopathy.  Neurological:     Mental Status: He is alert and oriented to person, place, and time.     No results found for any visits on 07/27/22.    Assessment & Plan     Problem List Items Addressed This Visit       Respiratory   Non-seasonal allergic rhinitis due to pollen    Sneezing and slightly enlarged nasal turbinates  Recommended flonase daily  Counseled patient on use, two sprays per nostril       Bronchitis - Primary    Acute Cough predominant symptoms  No lung sounds to raise suspicion for PNA at this time, afebrile  Will test for  COVID, influenza and RSV as patient reports being around coworker with similar symptoms  RX tessalon perles  RX pepcid for throat symptoms   Reviewed ED precautions including SOB, chest pain, lightheadedness, dizziness, dyspnea, patient voiced understanding      Other Visit Diagnoses     Cough in adult       Relevant Orders   COVID-19, Flu A+B and RSV        No follow-ups on file.      The entirety of the information documented in the History of Present Illness, Review of Systems and Physical Exam were personally obtained by me, Zadiel Leyh Simmons-Robinson. Portions of this information were initially documented by the CMA and reviewed by me for thoroughness and accuracy.     Stephen Ramp, MD  [facility] [phone] (phone) [phone] (fax)  [facility]

## 2022-07-27 NOTE — Assessment & Plan Note (Signed)
Sneezing and slightly enlarged nasal turbinates  Recommended flonase daily  Counseled patient on use, two sprays per nostril

## 2022-07-27 NOTE — Assessment & Plan Note (Addendum)
Acute Cough predominant symptoms  No lung sounds to raise suspicion for PNA at this time, afebrile  Will test for COVID, influenza and RSV as patient reports being around coworker with similar symptoms  RX tessalon perles  RX pepcid for throat symptoms   Reviewed ED precautions including SOB, chest pain, lightheadedness, dizziness, dyspnea, patient voiced understanding

## 2022-07-29 LAB — COVID-19, FLU A+B AND RSV
Influenza A, NAA: NOT DETECTED
Influenza B, NAA: NOT DETECTED
RSV, NAA: NOT DETECTED
SARS-CoV-2, NAA: NOT DETECTED
